# Patient Record
Sex: Female | Born: 1995 | Race: Black or African American | Hispanic: No | Marital: Single | Smoking: Never smoker
Health system: Southern US, Community
[De-identification: ages and names within clinical notes are randomized; demographics above are authoritative.]

## PROBLEM LIST (undated history)

## (undated) DIAGNOSIS — J45909 Unspecified asthma, uncomplicated: Secondary | ICD-10-CM

---

## 2015-01-07 ENCOUNTER — Encounter (HOSPITAL_COMMUNITY): Payer: Self-pay | Admitting: Emergency Medicine

## 2015-01-07 ENCOUNTER — Emergency Department (HOSPITAL_COMMUNITY)
Admission: EM | Admit: 2015-01-07 | Discharge: 2015-01-07 | Disposition: A | Discharge status: Home / Self Care | Payer: Self-pay | Attending: Emergency Medicine | Admitting: Emergency Medicine

## 2015-01-07 DIAGNOSIS — L02411 Cutaneous abscess of right axilla: Secondary | ICD-10-CM

## 2015-01-07 MED ORDER — LIDOCAINE HCL (PF) 1 % IJ SOLN: 5.0000 mL | Freq: Once | INTRAMUSCULAR | Status: DC

## 2015-01-07 MED ORDER — HYDROCODONE-ACETAMINOPHEN 5-325 MG PO TABS: 1.0000 | ORAL_TABLET | ORAL | Status: DC | PRN

## 2015-01-07 MED ORDER — LIDOCAINE HCL 1 % IJ SOLN
20.0000 mL | Freq: Once | INTRAMUSCULAR | Status: AC
  Administered 2015-01-07: 20 mL
  Filled 2015-01-07: qty 20

## 2015-01-07 NOTE — Discharge Instructions (Signed)

## 2015-01-07 NOTE — ED Provider Notes (Signed)
CSN: [id number]     Arrival date & time 01/07/15  1759 History  This chart was scribed for non-physician provider Elpidio Anis, PA-C, working with Lorre Nick, MD by Phillis Haggis, ED Scribe. This patient was seen in room WTR9/WTR9 and patient care was started at 8:17 PM.   Chief Complaint  Patient presents with  . Abscess   The history is provided by the patient. No language interpreter was used.  HPI Comments: Becky Mcdonald is a 19 y.o. female who presents to the Emergency Department complaining of abscess under bilateral axilla onset two months ago. She states that she saw her PCP two months ago and was told to not shave under her arms to try and relieve symptoms. She states that they first started as small bumps, but worsened; states that the left axillary bump popped and mostly drained, but there is still something in there. She reports pain to both areas, rates 7/10. She states that she went to another hospital last night and was given anti-biotics. She denies history of similar symptoms. She denies fever, chills, rash, nausea, or vomiting.   History reviewed. No pertinent past medical history. History reviewed. No pertinent past surgical history. No family history on file. History  Substance Use Topics  . Smoking status: Never Smoker   . Smokeless tobacco: Not on file  . Alcohol Use: No   OB History    No data available     Review of Systems  Constitutional: Negative for fever and chills.  Gastrointestinal: Negative for nausea and vomiting.  Skin: Positive for wound. Negative for rash.    Allergies  Review of patient's allergies indicates no known allergies.  Home Medications   Prior to Admission medications   Not on File   BP 129/77 mmHg  Pulse 82  Temp(Src) 98.1 F (36.7 C) (Oral)  Resp 14  SpO2 100%  LMP 12/29/2014 (Exact Date)  Physical Exam  Constitutional: She is oriented to person, place, and time. She appears well-developed and well-nourished. No  distress.  HENT:  Head: Normocephalic and atraumatic.  Mouth/Throat: Oropharynx is clear and moist.  Eyes: Conjunctivae and EOM are normal.  Neck: Normal range of motion. Neck supple.  Cardiovascular: Normal rate, regular rhythm and normal heart sounds.   Pulmonary/Chest: Effort normal and breath sounds normal. No respiratory distress.  Musculoskeletal: Normal range of motion. She exhibits no edema.  Neurological: She is alert and oriented to person, place, and time. No sensory deficit.  Skin: Skin is warm and dry.  Swollen fluctuant non-erythematous area in right axilla measuring 2 x 3 cm consistent with abscess  Psychiatric: She has a normal mood and affect. Her behavior is normal.  Nursing note and vitals reviewed.   ED Course  Procedures (including critical care time) DIAGNOSTIC STUDIES:   COORDINATION OF CARE: 8:21 PM-Discussed treatment plan which includes abscess drainage and referral to surgeon with pt at bedside and pt agreed to plan.    Labs Review Labs Reviewed - No data to display  Imaging Review No results found.   EKG Interpretation None     INCISION AND DRAINAGE Performed by: Elpidio Anis A Consent: Verbal consent obtained. Risks and benefits: risks, benefits and alternatives were discussed Type: abscess  Body area: right axilla  Anesthesia: local infiltration  Incision was made with a scalpel.  Local anesthetic: lidocaine 2% w/o epinephrine  Anesthetic total: 2 ml  Complexity: complex Blunt dissection to break up loculations  Drainage: purulent  Drainage amount: small  Packing material:  1/4 in iodoform gauze  Patient tolerance: Patient tolerated the procedure well with no immediate complications.    MDM   Final diagnoses:  None    1. Axillary abscess  Minimal purulent drainage from abscess to right axilla. Will refer to surgery for further outpatient management of recurrent symptoms.   I personally performed the services  described in this documentation, which was scribed in my presence. The recorded information has been reviewed and is accurate.     Elpidio Anis, PA-C 01/08/15 1610  Lorre Nick, MD 01/11/15 1228

## 2015-01-07 NOTE — ED Notes (Signed)
Pt has abscesses to bilateral axillas, left side popped but right side did not.

## 2015-01-09 ENCOUNTER — Emergency Department (HOSPITAL_COMMUNITY)
Admission: EM | Admit: 2015-01-09 | Discharge: 2015-01-09 | Discharge status: Against Medical Advice | Payer: Self-pay | Attending: Emergency Medicine | Admitting: Emergency Medicine

## 2015-01-09 ENCOUNTER — Encounter (HOSPITAL_COMMUNITY): Payer: Self-pay | Admitting: *Deleted

## 2015-01-09 DIAGNOSIS — Z4801 Encounter for change or removal of surgical wound dressing: Secondary | ICD-10-CM | POA: Insufficient documentation

## 2015-01-09 DIAGNOSIS — Z5189 Encounter for other specified aftercare: Secondary | ICD-10-CM

## 2015-01-09 NOTE — ED Provider Notes (Signed)
Patient left the ED without being seen by the provider.  Filed Vitals:   01/09/15 1449  BP: 138/76  Pulse: 83  Temp: 98.8 F (37.1 C)  Resp: [address] PA-C 01/09/15 1610  Marlon Pel, PA-C 01/09/15 1610  Eber Hong, MD 01/09/15 1735

## 2015-01-09 NOTE — ED Notes (Signed)
Pt just walked out of department.

## 2015-01-09 NOTE — ED Notes (Signed)
Pt was seen on Monday 6/13 for bil axilla abscess. Pt had right axilla abscess lanced. Was told to follow up in 3 days to see if pt needed to have surgery. Pain 8/10.

## 2015-01-16 ENCOUNTER — Emergency Department (HOSPITAL_COMMUNITY)
Admission: EM | Admit: 2015-01-16 | Discharge: 2015-01-16 | Disposition: A | Discharge status: Home / Self Care | Payer: Self-pay | Attending: Emergency Medicine | Admitting: Emergency Medicine

## 2015-01-16 ENCOUNTER — Encounter (HOSPITAL_COMMUNITY): Payer: Self-pay | Admitting: *Deleted

## 2015-01-16 DIAGNOSIS — K5900 Constipation, unspecified: Secondary | ICD-10-CM | POA: Insufficient documentation

## 2015-01-16 DIAGNOSIS — L02411 Cutaneous abscess of right axilla: Secondary | ICD-10-CM | POA: Insufficient documentation

## 2015-01-16 DIAGNOSIS — R110 Nausea: Secondary | ICD-10-CM | POA: Insufficient documentation

## 2015-01-16 DIAGNOSIS — L02412 Cutaneous abscess of left axilla: Secondary | ICD-10-CM | POA: Insufficient documentation

## 2015-01-16 DIAGNOSIS — R630 Anorexia: Secondary | ICD-10-CM | POA: Insufficient documentation

## 2015-01-16 DIAGNOSIS — L02419 Cutaneous abscess of limb, unspecified: Secondary | ICD-10-CM

## 2015-01-16 LAB — CBC WITH DIFFERENTIAL/PLATELET
BASOS ABS: 0 10*3/uL (ref 0.0–0.1)
BASOS PCT: 0 % (ref 0–1)
Eosinophils Absolute: 0.1 10*3/uL (ref 0.0–0.7)
Eosinophils Relative: 1 % (ref 0–5)
HCT: 40.1 % (ref 36.0–46.0)
Hemoglobin: 13.8 g/dL (ref 12.0–15.0)
Lymphocytes Relative: 33 % (ref 12–46)
Lymphs Abs: 2.7 10*3/uL (ref 0.7–4.0)
MCH: 28.6 pg (ref 26.0–34.0)
MCHC: 34.4 g/dL (ref 30.0–36.0)
MCV: 83 fL (ref 78.0–100.0)
Monocytes Absolute: 0.6 10*3/uL (ref 0.1–1.0)
Monocytes Relative: 7 % (ref 3–12)
NEUTROS ABS: 4.7 10*3/uL (ref 1.7–7.7)
NEUTROS PCT: 59 % (ref 43–77)
PLATELETS: 271 10*3/uL (ref 150–400)
RBC: 4.83 MIL/uL (ref 3.87–5.11)
RDW: 13.2 % (ref 11.5–15.5)
WBC: 8 10*3/uL (ref 4.0–10.5)

## 2015-01-16 MED ORDER — OXYCODONE-ACETAMINOPHEN 5-325 MG PO TABS
2.0000 | ORAL_TABLET | Freq: Once | ORAL | Status: AC
  Administered 2015-01-16: 2 via ORAL
  Filled 2015-01-16: qty 2

## 2015-01-16 MED ORDER — LIDOCAINE HCL (PF) 1 % IJ SOLN
30.0000 mL | Freq: Once | INTRAMUSCULAR | Status: DC
  Filled 2015-01-16: qty 30

## 2015-01-16 MED ORDER — DOXYCYCLINE HYCLATE 100 MG PO TABS: 100.0000 mg | ORAL_TABLET | Freq: Two times a day (BID) | ORAL | Status: DC

## 2015-01-16 MED ORDER — LIDOCAINE-EPINEPHRINE 2 %-1:100000 IJ SOLN
20.0000 mL | Freq: Once | INTRAMUSCULAR | Status: DC
  Filled 2015-01-16: qty 20

## 2015-01-16 MED ORDER — LIDOCAINE-EPINEPHRINE (PF) 2 %-1:200000 IJ SOLN
20.0000 mL | Freq: Once | INTRAMUSCULAR | Status: AC
  Administered 2015-01-16: 20 mL via INTRADERMAL
  Filled 2015-01-16: qty 20

## 2015-01-16 MED ORDER — OXYCODONE-ACETAMINOPHEN 5-325 MG PO TABS: 1.0000 | ORAL_TABLET | ORAL | Status: DC | PRN

## 2015-01-16 NOTE — Op Note (Signed)
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  POST-OPERATIVE DIAGNOSIS:  Same  PROCEDURE:  Procedure(s): I&D bilateral axillary abscesses secondary to hidradenitis  SURGEON:  Surgeon(s): Almond Lint, MD  ANESTHESIA:   Percocet and 1% lidocaine with epi  DRAINS: none   LOCAL MEDICATIONS USED:  LIDOCAINE   SPECIMEN:  Source of Specimen:  culture from each axilla  DISPOSITION OF SPECIMEN:  microbiology  PLAN OF CARE: d/c home  PATIENT DISPOSITION:  fast track  FINDINGS:  Thick purulent drainage deep in bilateral axilla  EBL: min  PROCEDURE:   The procedure, risks and complications have been discussed in detail (including, infection, bleeding, need for additional procedures) with the patient, and the patient has signed consent to the procedure. The patient was informed that the wound would be left open.  The patient was placed into a comfortable position in her chair in fast track.  The skin of the right axilla was sterilely prepped and draped over the affected area in the usual fashion. Lidocaine was administered around the area of fluctuance.  I&D with a #11 blade was performed on the right axillary region.  Cultures were sent. Purulent drainage was present deep in the axilla.  The wound was packed with 1/4 inch iodoform.    The left side was addressed similarly.    The patient tolerated the procedure well.

## 2015-01-16 NOTE — ED Notes (Signed)
MD at bedside. 

## 2015-01-16 NOTE — Discharge Instructions (Signed)
Remove packing in 48 hours in shower.  OK to take ibuprofen.  If you take narcotics, do not drive.  Also, take stool softeners with narcotics.        Hidradenitis Suppurativa, Sweat Gland Abscess Hidradenitis suppurativa is a long lasting (chronic), uncommon disease of the sweat glands. With this, boil-like lumps and scarring develop in the groin, some times under the arms (axillae), and under the breasts. It may also uncommonly occur behind the ears, in the crease of the buttocks, and around the genitals.  CAUSES  The cause is from a blocking of the sweat glands. They then become infected. It may cause drainage and odor. It is not contagious. So it cannot be given to someone else. It most often shows up in puberty (about 62 to 20 years of age). But it may happen much later. It is similar to acne which is a disease of the sweat glands. This condition is slightly more common in African-Americans and women. SYMPTOMS   Hidradenitis usually starts as one or more red, tender, swellings in the groin or under the arms (axilla).  Over a period of hours to days the lesions get larger. They often open to the skin surface, draining clear to yellow-colored fluid.  The infected area heals with scarring. DIAGNOSIS  Your caregiver makes this diagnosis by looking at you. Sometimes cultures (growing germs on plates in the lab) may be taken. This is to see what germ (bacterium) is causing the infection.  TREATMENT   Topical germ killing medicine applied to the skin (antibiotics) are the treatment of choice. Antibiotics taken by mouth (systemic) are sometimes needed when the condition is getting worse or is severe.  Avoid tight-fitting clothing which traps moisture in.  Dirt does not cause hidradenitis and it is not caused by poor hygiene.  Involved areas should be cleaned daily using an antibacterial soap. Some patients find that the liquid form of Lever 2000, applied to the involved areas as a lotion  after bathing, can help reduce the odor related to this condition.  Sometimes surgery is needed to drain infected areas or remove scarred tissue. Removal of large amounts of tissue is used only in severe cases.  Birth control pills may be helpful.  Oral retinoids (vitamin A derivatives) for 6 to 12 months which are effective for acne may also help this condition.  Weight loss will improve but not cure hidradenitis. It is made worse by being overweight. But the condition is not caused by being overweight.  This condition is more common in people who have had acne.  It may become worse under stress. There is no medical cure for hidradenitis. It can be controlled, but not cured. The condition usually continues for years with periods of getting worse and getting better (remission). Document Released: 02/25/2004 Document Revised: 10/05/2011 Document Reviewed: 10/13/2013 [facility] Patient Information 2015 [place], [place]. This information is not intended to replace advice given to you by your health care provider. Make sure you discuss any questions you have with your health care provider.

## 2015-01-16 NOTE — Consult Note (Signed)
Reason for Consult: axillary abscesses Referring Physician: Dr. Merlene Laughter  Becky Mcdonald is an 19 y.o. female.  HPI: She was seen at [facility] 6/13 and had an I&D of right axillary abscess.  She was apparently referred to our clinic, however, unable to be seen due to insurance issues.  She presents today with apparent fevers of 103, axillary pain associated with nausea and constipation from pain medicine.   This is now better after taking dulcolax.   She reports draining on the left side.  None on the left.  She completed her antibiotic course.  Mother endorses to same symptoms when she was younger, but none now.  No family history of hidradenitis other than this.  Reports moderate pain.  Denies chills or sweats.   History reviewed. No pertinent past medical history.  History reviewed. No pertinent past surgical history.  History reviewed. No pertinent family history.  Social History:  reports that she has never smoked. She does not have any smokeless tobacco history on file. She reports that she does not drink alcohol. Her drug history is not on file.  Allergies: No Known Allergies  Medications:  Prior to Admission medications   Medication Sig Start Date End Date Taking? Authorizing Provider  HYDROcodone-acetaminophen (NORCO/VICODIN) 5-325 MG per tablet Take 1-2 tablets by mouth every 4 (four) hours as needed. 01/07/15   Elpidio Anis, PA-C     No results found for this or any previous visit (from the past 48 hour(s)).  No results found.  Review of Systems  All other systems reviewed and are negative.  Blood pressure 109/52, pulse 73, temperature 98.4 F (36.9 C), temperature source Oral, resp. rate 20, last menstrual period 12/29/2014, SpO2 100 %. Physical Exam  Constitutional: She is oriented to person, place, and time. She appears well-developed and well-nourished. No distress.  Cardiovascular: Normal rate, regular rhythm, normal heart sounds and intact distal pulses.  Exam reveals  no gallop and no friction rub.   No murmur heard. Respiratory: Effort normal and breath sounds normal. No respiratory distress. She has no wheezes. She has no rales. She exhibits no tenderness.  GI: Soft. Bowel sounds are normal.  Neurological: She is alert and oriented to person, place, and time.  Skin: She is not diaphoretic.  Right axillae-about 1x3cm area of fluctuance and tenderness, no drainage. Left axillae-several areas of induration and a pinpoint area of purulent drainage.   Psychiatric: She has a normal mood and affect. Her behavior is normal. Judgment and thought content normal.    Assessment/Plan: Axillary hidradenitis: seems pretty mild, but may need a bedside I&D on the right side and home with clindamycin.  Will discuss with attending.  She will need patient education from [facility] for ongoing self care.  Further recs to follow.   Jayzon Taras ANP-BC 01/16/2015, 4:25 PM

## 2015-01-16 NOTE — ED Notes (Signed)
Pt given an XL scrub top to change into

## 2015-01-16 NOTE — ED Notes (Signed)
NP at bedside updating pt and mother.

## 2015-01-16 NOTE — ED Notes (Signed)
Pt presents today for recheck of abscess in each axilla. Pt was treated on 01-08-15 for same. Pt reports her pain scale is 10/10 in the Rt and LT. Mother reported " [facility] will not see Pt with out insn." Pt mother wants to know If Daughter can be admitted to have surgery on sites in each axilla.

## 2015-01-16 NOTE — ED Provider Notes (Signed)
CSN: [id number]     Arrival date & time 01/16/15  1438 History   This chart was scribed for non-physician practitioner,Hope M. Damian Leavell, NP, working with Samuel Jester, DO, by Budd Palmer ED Scribe. This patient was seen in room TR07C/TR07C and the patient's care was started at 3:11 PM   Chief Complaint  Patient presents with  . Wound Check   The history is provided by the patient. No language interpreter was used.   HPI Comments: Becky Mcdonald is a 19 y.o. female who presents to the Emergency Department complaining of two constantly painful boils, one under each armpit, onset several weeks ago. She states that she went to [facility] 2 weeks ago, where she was diagnosed with an abscess under the right and left axilla.  She received antibiotics and pain medication, which she has taken with no relief.  She was also referred to [facility], but is uninsured and found the cost of the consult to be prohibitive. Since this time, the pain has continued and she notes loss of appetite, constipation, nausea, and difficulty sleeping.  She began taking stool softener last night. She denies associated fever and chills.  History reviewed. No pertinent past medical history. History reviewed. No pertinent past surgical history. History reviewed. No pertinent family history. History  Substance Use Topics  . Smoking status: Never Smoker   . Smokeless tobacco: Not on file  . Alcohol Use: No   OB History    No data available     Review of Systems  Constitutional: Positive for appetite change. Negative for fever and chills.  Gastrointestinal: Positive for nausea and constipation.  Skin: Positive for wound.  all other systems negative  Allergies  Review of patient's allergies indicates no known allergies.  Home Medications   Prior to Admission medications   Medication Sig Start Date End Date Taking? Authorizing Provider  doxycycline (VIBRA-TABS) 100 MG tablet Take 1 tablet (100 mg  total) by mouth 2 (two) times daily. 01/16/15   Almond Lint, MD  HYDROcodone-acetaminophen (NORCO/VICODIN) 5-325 MG per tablet Take 1-2 tablets by mouth every 4 (four) hours as needed. 01/07/15   Elpidio Anis, PA-C  oxyCODONE-acetaminophen (ROXICET) 5-325 MG per tablet Take 1-2 tablets by mouth every 4 (four) hours as needed for severe pain. 01/16/15   Almond Lint, MD   BP 109/52 mmHg  Pulse 73  Temp(Src) 98.4 F (36.9 C) (Oral)  Resp 20  SpO2 100%  LMP 12/29/2014 (Exact Date) Physical Exam  Constitutional: She is oriented to person, place, and time. She appears well-developed and well-nourished. No distress.  HENT:  Head: Normocephalic and atraumatic.  Eyes: Conjunctivae and EOM are normal.  Neck: Normal range of motion. Neck supple.  Cardiovascular: Normal rate.   Pulmonary/Chest: Effort normal. No respiratory distress.  Abdominal: Soft.  Musculoskeletal: Normal range of motion.  Bilateral axilla with raised tender areas suggestive of hidradenitis   Neurological: She is alert and oriented to person, place, and time.  Skin: Skin is warm and dry.  Psychiatric: She has a normal mood and affect. Her behavior is normal.  Nursing note and vitals reviewed.   ED Course  Procedures  DIAGNOSTIC STUDIES: Oxygen Saturation is 100% on RA, normal by my interpretation.    COORDINATION OF CARE: 3:14 PM - Discussed plans to perform a possible incision and drainage. Pt advised of plan for treatment and pt agrees. Discussed with Dr. Clarene Duke and she suggest consult with [facility]. Discussed with [facility] and they will see the patient  in the ED now.   MDM  Dr. Donell Beers in to I&D abscess to bilateral axilla (see surgical consult note)  Final diagnoses:  Abscess, axilla   I personally performed the services described in this documentation, which was scribed in my presence. The recorded information has been reviewed and is accurate.    [facility] Orlene Och, NP 01/16/15 1926  Samuel Jester, DO 01/20/15  1345

## 2015-01-19 LAB — WOUND CULTURE: SPECIAL REQUESTS: NORMAL

## 2015-01-20 LAB — WOUND CULTURE
Culture: NO GROWTH
Special Requests: NORMAL

## 2015-01-30 ENCOUNTER — Emergency Department (HOSPITAL_COMMUNITY)
Admission: EM | Admit: 2015-01-30 | Discharge: 2015-01-30 | Disposition: A | Discharge status: Home / Self Care | Payer: Self-pay | Attending: Emergency Medicine | Admitting: Emergency Medicine

## 2015-01-30 ENCOUNTER — Encounter (HOSPITAL_COMMUNITY): Payer: Self-pay | Admitting: Emergency Medicine

## 2015-01-30 DIAGNOSIS — Z792 Long term (current) use of antibiotics: Secondary | ICD-10-CM | POA: Insufficient documentation

## 2015-01-30 DIAGNOSIS — L02411 Cutaneous abscess of right axilla: Secondary | ICD-10-CM | POA: Insufficient documentation

## 2015-01-30 DIAGNOSIS — Z4801 Encounter for change or removal of surgical wound dressing: Secondary | ICD-10-CM | POA: Insufficient documentation

## 2015-01-30 DIAGNOSIS — R509 Fever, unspecified: Secondary | ICD-10-CM | POA: Insufficient documentation

## 2015-01-30 DIAGNOSIS — Z5189 Encounter for other specified aftercare: Secondary | ICD-10-CM

## 2015-01-30 NOTE — ED Notes (Signed)
PA at bedside.

## 2015-01-30 NOTE — ED Notes (Signed)
Family at bedside. 

## 2015-01-30 NOTE — Discharge Instructions (Signed)

## 2015-01-30 NOTE — ED Notes (Signed)
See PAs note for assessment.

## 2015-01-30 NOTE — ED Notes (Signed)
Pt had abscesses under arm I&D on 6/22. sts under R arm abscess returned and some of the fluid has come out. Pt has follow up apt tomorrow.

## 2015-01-30 NOTE — ED Provider Notes (Signed)
CSN: [id number]     Arrival date & time 01/30/15  2049 History  This chart was scribed for Roxy Horseman, PA-C working with Cathren Laine, MD by Evon Slack, ED Scribe. This patient was seen in room TR05C/TR05C and the patient's care was started at 9:06 PM.      Chief Complaint  Patient presents with  . Abscess    The history is provided by the patient and a parent. No language interpreter was used.   HPI Comments: Becky Mcdonald is a 19 y.o. female who presents to the Emergency Department complaining of bilateral axilla abscess onset 2 weeks prior. Pt states that she still has some associated drainage from the right axilla. Pt also reports associated fever (max temp 103). Pt states that she was evaluated at [facility] 2 weeks prior for the same symptoms. She states that she was evaluated 1 week prior here at [facility] ED and had to have the abscesses surgically drained. She states that each abscess was packed and that she removed the packing without any complications. Pt states that she has been complaint with taking the antibiotics. She states that today she has taken tylenol with relief of the fever. Pt states that she called the surgeons office today and was told to come to ED if the abscess continued to drain. Pt states that she has a follow up appointment with the surgeon tomorrow.     History reviewed. No pertinent past medical history. History reviewed. No pertinent past surgical history. No family history on file. History  Substance Use Topics  . Smoking status: Never Smoker   . Smokeless tobacco: Not on file  . Alcohol Use: No   OB History    No data available     Review of Systems  Constitutional: Positive for fever.  Skin:       right axilla abscess     Allergies  Review of patient's allergies indicates no known allergies.  Home Medications   Prior to Admission medications   Medication Sig Start Date End Date Taking? Authorizing Provider  acetaminophen (TYLENOL)  325 MG tablet Take 650 mg by mouth every 6 (six) hours as needed for mild pain.   Yes Historical Provider, MD  doxycycline (VIBRA-TABS) 100 MG tablet Take 1 tablet (100 mg total) by mouth 2 (two) times daily. 01/16/15  Yes Almond Lint, MD  oxyCODONE-acetaminophen (ROXICET) 5-325 MG per tablet Take 1-2 tablets by mouth every 4 (four) hours as needed for severe pain. 01/16/15  Yes Almond Lint, MD   BP 135/71 mmHg  Pulse 65  Temp(Src) 97.7 F (36.5 C) (Oral)  Resp 16  SpO2 99%  LMP 01/26/2015   Physical Exam  Constitutional: She is oriented to person, place, and time. She appears well-developed and well-nourished. No distress.  HENT:  Head: Normocephalic and atraumatic.  Eyes: Conjunctivae and EOM are normal.  Neck: Neck supple. No tracheal deviation present.  Cardiovascular: Normal rate.   Pulmonary/Chest: Effort normal. No respiratory distress.  Musculoskeletal: Normal range of motion.  Neurological: She is alert and oriented to person, place, and time.  Skin: Skin is warm and dry.  Moderate purulent discharge from right axilla, no surrounding cellulitis, mildly indurated.   Psychiatric: She has a normal mood and affect. Her behavior is normal.  Nursing note and vitals reviewed.   ED Course  Procedures (including critical care time) DIAGNOSTIC STUDIES: Oxygen Saturation is 99% on RA, normal by my interpretation.    COORDINATION OF CARE: 9:21 PM-Discussed treatment plan  with pt at bedside and pt agreed to plan.  10:03 PM-Spoke with Dr. Denton LankSteinl and he recommended surgery follow up tomorrow.    Labs Review Labs Reviewed - No data to display  Imaging Review No results found.   EKG Interpretation None      MDM   Final diagnoses:  Wound check, abscess   Patient here for wound check.  Right axilla still draining some mild purulence.  No surrounding erythema, no palpable abscess.  Will recommend keeping surgery follow-up tomorrow.  Vitals are stable.  Non-toxic  appearing.  Filed Vitals:   01/30/15 2207  BP: 131/69  Pulse: 85  Temp:   Resp: 18     I personally performed the services described in this documentation, which was scribed in my presence. The recorded information has been reviewed and is accurate.       Roxy Horsemanobert Ardena Gangl, PA-C 01/30/15 2213  Cathren LaineKevin Steinl, MD 02/04/15 [phone]

## 2015-01-31 ENCOUNTER — Other Ambulatory Visit: Payer: Self-pay | Admitting: *Deleted

## 2015-01-31 ENCOUNTER — Other Ambulatory Visit: Payer: Self-pay | Admitting: General Surgery

## 2015-01-31 DIAGNOSIS — L02419 Cutaneous abscess of limb, unspecified: Secondary | ICD-10-CM

## 2015-02-05 ENCOUNTER — Ambulatory Visit
Admission: RE | Admit: 2015-02-05 | Discharge: 2015-02-05 | Disposition: A | Discharge status: Home / Self Care | Payer: No Typology Code available for payment source | Source: Ambulatory Visit | Attending: General Surgery | Admitting: General Surgery

## 2015-02-05 DIAGNOSIS — L02419 Cutaneous abscess of limb, unspecified: Secondary | ICD-10-CM

## 2015-02-05 MED ORDER — IOPAMIDOL (ISOVUE-300) INJECTION 61%
75.0000 mL | Freq: Once | INTRAVENOUS | Status: AC | PRN
  Administered 2015-02-05: 75 mL via INTRAVENOUS

## 2016-03-24 ENCOUNTER — Encounter (HOSPITAL_COMMUNITY): Payer: Self-pay | Admitting: Emergency Medicine

## 2016-03-24 ENCOUNTER — Emergency Department (HOSPITAL_COMMUNITY)
Admission: EM | Admit: 2016-03-24 | Discharge: 2016-03-24 | Discharge status: Against Medical Advice | Payer: Self-pay | Attending: Dermatology | Admitting: Dermatology

## 2016-03-24 DIAGNOSIS — J45909 Unspecified asthma, uncomplicated: Secondary | ICD-10-CM | POA: Insufficient documentation

## 2016-03-24 DIAGNOSIS — Z79899 Other long term (current) drug therapy: Secondary | ICD-10-CM | POA: Insufficient documentation

## 2016-03-24 DIAGNOSIS — R0602 Shortness of breath: Secondary | ICD-10-CM | POA: Insufficient documentation

## 2016-03-24 HISTORY — DX: Unspecified asthma, uncomplicated: J45.909

## 2016-03-24 MED ORDER — ALBUTEROL SULFATE (2.5 MG/3ML) 0.083% IN NEBU
5.0000 mg | INHALATION_SOLUTION | Freq: Once | RESPIRATORY_TRACT | Status: AC
  Administered 2016-03-24: 5 mg via RESPIRATORY_TRACT

## 2016-03-24 NOTE — ED Triage Notes (Addendum)
Pt states that she ran out of her inhaler tonight and is feeling SOB. States her new one comes tomorrow. 100% RA. Hx of Asthma. Alert and oriented.

## 2016-03-24 NOTE — ED Triage Notes (Signed)
Pt called x 2 w/o answer in lobby 

## 2016-03-24 NOTE — ED Triage Notes (Signed)
Pt called from triage, no answer 

## 2016-07-02 ENCOUNTER — Emergency Department (HOSPITAL_COMMUNITY): Payer: Self-pay

## 2016-07-02 ENCOUNTER — Encounter (HOSPITAL_COMMUNITY): Payer: Self-pay | Admitting: Emergency Medicine

## 2016-07-02 ENCOUNTER — Emergency Department (HOSPITAL_COMMUNITY)
Admission: EM | Admit: 2016-07-02 | Discharge: 2016-07-02 | Disposition: A | Discharge status: Home / Self Care | Payer: Self-pay | Attending: Emergency Medicine | Admitting: Emergency Medicine

## 2016-07-02 DIAGNOSIS — J019 Acute sinusitis, unspecified: Secondary | ICD-10-CM

## 2016-07-02 DIAGNOSIS — J0100 Acute maxillary sinusitis, unspecified: Secondary | ICD-10-CM | POA: Insufficient documentation

## 2016-07-02 DIAGNOSIS — J069 Acute upper respiratory infection, unspecified: Secondary | ICD-10-CM | POA: Insufficient documentation

## 2016-07-02 DIAGNOSIS — J0110 Acute frontal sinusitis, unspecified: Secondary | ICD-10-CM | POA: Insufficient documentation

## 2016-07-02 DIAGNOSIS — J45909 Unspecified asthma, uncomplicated: Secondary | ICD-10-CM | POA: Insufficient documentation

## 2016-07-02 MED ORDER — ALBUTEROL SULFATE HFA 108 (90 BASE) MCG/ACT IN AERS: 2.0000 | INHALATION_SPRAY | Freq: Four times a day (QID) | RESPIRATORY_TRACT | 1 refills | Status: AC | PRN

## 2016-07-02 MED ORDER — ALBUTEROL SULFATE (2.5 MG/3ML) 0.083% IN NEBU
5.0000 mg | INHALATION_SOLUTION | Freq: Once | RESPIRATORY_TRACT | Status: AC
  Administered 2016-07-02: 5 mg via RESPIRATORY_TRACT
  Filled 2016-07-02: qty 6

## 2016-07-02 MED ORDER — DM-GUAIFENESIN ER 30-600 MG PO TB12: 1.0000 | ORAL_TABLET | Freq: Two times a day (BID) | ORAL | 0 refills | Status: AC

## 2016-07-02 MED ORDER — AMOXICILLIN-POT CLAVULANATE 875-125 MG PO TABS: 1.0000 | ORAL_TABLET | Freq: Two times a day (BID) | ORAL | 0 refills | Status: AC

## 2016-07-02 MED ORDER — FLUTICASONE PROPIONATE 50 MCG/ACT NA SUSP: 2.0000 | Freq: Every day | NASAL | 0 refills | Status: DC

## 2016-07-02 MED ORDER — SALINE SPRAY 0.65 % NA SOLN: 1.0000 | NASAL | 0 refills | Status: AC | PRN

## 2016-07-02 NOTE — ED Notes (Signed)
ED Provider at bedside. 

## 2016-07-02 NOTE — ED Triage Notes (Signed)
Pt c/o body aches x 7 days, deep cough, nasal congestion, sinus pressure, headaches, SOB, back pain worse with inspiration, clear ocular discharge. 1 episode emesis today. No abdominal pain, fevers, chills, diarrhea. Hx asthma.

## 2016-07-02 NOTE — Discharge Instructions (Signed)
Thank you for visiting ED today. As it looks like you're having some upper respiratory infection causing worsening of your asthma, and sinusitis. Please take your medicine as directed.

## 2016-07-02 NOTE — ED Provider Notes (Signed)
Emergency Department Provider Note   I have reviewed the triage vital signs and the nursing notes.   HISTORY  Chief Complaint No chief complaint on file.   HPI Becky Mcdonald is a 20 y.o. female with past medical history significant for well-controlled asthma came to ED with complaint of worsening generalized body aches, headache, nasal and sinus congestion, cough with yellow mucus and shortness of breath for one week. She denies any fever and chills. She do endorse having some nausea and vomiting today. She states that her shortness of breath worsened today which brought her to ED. Her breathing improved with the treatment with nebulizer with Proventil. She was using ibuprofen 400 mg 3 times a day for the last 1 week with minimum relief. She was also using her albuterol inhaler almost every hourly with minimum relief. She was complaining of some chest pain associated with coughing.  Past Medical History:  Diagnosis Date  . Asthma     There are no active problems to display for this patient.   History reviewed. No pertinent surgical history.  Current Outpatient Rx  . Order #: [id number] Class: Historical Med    Allergies Patient has no known allergies.  No family history on file.  Social History Social History  Substance Use Topics  . Smoking status: Never Smoker  . Smokeless tobacco: Not on file  . Alcohol use No    Review of Systems Constitutional: No fever/chills,Headache Eyes: No visual changes. ENT: No sore throat. Sinus pain Cardiovascular:  chest pain Associated with cough Respiratory: shortness of breath. Gastrointestinal:  abdominal pain.   nausea,  vomiting.  No diarrhea.  No constipation. Genitourinary: Negative for dysuria. Musculoskeletal: Negative for back pain. Skin: Negative for rash. Neurological: Negative for headaches, focal weakness or numbness. 10-point ROS otherwise negative.  ____________________________________________   PHYSICAL  EXAM:  VITAL SIGNS: ED Triage Vitals  Enc Vitals Group     BP 07/02/16 1755 146/61     Pulse Rate 07/02/16 1755 84     Resp 07/02/16 1755 18     Temp 07/02/16 1755 97.8 F (36.6 C)     Temp Source 07/02/16 1755 Oral     SpO2 07/02/16 1755 100 %     Weight 07/02/16 1755 145 lb (65.8 kg)     Height 07/02/16 1755 5\' 4"  (1.626 m)     Head Circumference --      Peak Flow --      Pain Score 07/02/16 1801 10     Pain Loc --      Pain Edu? --      Excl. in GC? --    Constitutional: Alert and oriented. Well appearing and in no acute distress. Eyes: Conjunctivae are normal. PERRL. EOMI. Head: Atraumatic.Maxillary and frontal sinus tenderness. Ears:  Healthy appearing ear canals and TMs bilaterally Nose: Mild congestion and rhinnorhea. Mouth/Throat: Mucous membranes are moist.  Oropharynx non-erythematous. Neck: No stridor.  No meningeal signs.  Cardiovascular: Normal rate, regular rhythm. Good peripheral circulation. Grossly normal heart sounds.   Respiratory: Normal respiratory effort.  No retractions. Lungs CTAB. Gastrointestinal: Soft and nontender. No distention.  Neurologic:  Normal speech and language. No gross focal neurologic deficits are appreciated.  Skin:  Skin is warm, dry and intact. No rash noted. Psychiatric: Mood and affect are normal. Speech and behavior are normal.  ____________________________________________   LABS (all labs ordered are listed, but only abnormal results are displayed)  Labs Reviewed - No data to display ____________________________________________  EKG  None ____________________________________________  RADIOLOGY  Dg Chest 2 View  Result Date: 07/02/2016 CLINICAL DATA:  20 year old female with shortness of breath cough and body ache for 1 week. Initial encounter. EXAM: CHEST  2 VIEW COMPARISON:  None. FINDINGS: Lung volumes at the upper limits of normal to hyperinflated. Normal cardiac size and mediastinal contours. Visualized tracheal air  column is within normal limits. No pneumothorax, pulmonary edema, pleural effusion or confluent pulmonary opacity. Borderline to mild increased pulmonary interstitial markings. No acute osseous abnormality identified. Negative visible bowel gas pattern. IMPRESSION: Up to mild increased pulmonary interstitial markings, and evidence of pulmonary hyperinflation. Consider viral or atypical respiratory infection. No focal pneumonia or pleural effusion. Electronically Signed   By: Odessa FlemingH  Hall M.D.   On: 07/02/2016 18:40    ____________________________________________   PROCEDURES  Procedure(s) performed:   Procedures   ____________________________________________   INITIAL IMPRESSION / ASSESSMENT AND PLAN / ED COURSE  Pertinent labs & imaging results that were available during my care of the patient were reviewed by me and considered in my medical decision making (see chart for details).  Becky Mcdonald is a 20 y.o. female with past medical history significant for well-controlled asthma came to ED with complaint of worsening generalized body aches, headache, nasal and sinus congestion, cough with yellow mucus and shortness of breath for one week. She denies any fever and chills. She do endorse having some nausea and vomiting today.  Her shortness of breath improved with Proventil nebulizer treatment. Chest x-ray was showing mild increased pulmonary interstitial markings, and evidence of pulmonary hyperinflation. Consider viral or atypical respiratory infection. No focal pneumonia or pleural effusion.  Her symptoms are consistent with upper respiratory infection and sinusitis.  She is being discharged home with Augmentin, albuterol inhaler with spacer, Flonase nasal spray and Mucinex.    ____________________________________________  FINAL CLINICAL IMPRESSION(S) / ED DIAGNOSES  Final diagnoses:  None     MEDICATIONS GIVEN DURING THIS VISIT:  Medications  albuterol (PROVENTIL) (2.5  MG/3ML) 0.083% nebulizer solution 5 mg (5 mg Nebulization Given 07/02/16 1808)     NEW OUTPATIENT MEDICATIONS STARTED DURING THIS VISIT:  New Prescriptions   No medications on file      Note:  This document was prepared using Dragon voice recognition software and may include unintentional dictation errors.  Alona BeneJoshua Long, MD Emergency Medicine   Arnetha CourserSumayya Areon Cocuzza, MD 07/02/16 [id number]    Jacalyn LefevreJulie Haviland, MD 07/02/16 [phone]

## 2018-01-12 ENCOUNTER — Emergency Department (HOSPITAL_COMMUNITY)
Admission: EM | Admit: 2018-01-12 | Discharge: 2018-01-12 | Disposition: A | Discharge status: Home / Self Care | Payer: Self-pay | Attending: Emergency Medicine | Admitting: Emergency Medicine

## 2018-01-12 ENCOUNTER — Encounter (HOSPITAL_COMMUNITY): Payer: Self-pay

## 2018-01-12 ENCOUNTER — Other Ambulatory Visit: Payer: Self-pay

## 2018-01-12 DIAGNOSIS — R058 Other specified cough: Secondary | ICD-10-CM

## 2018-01-12 DIAGNOSIS — R0981 Nasal congestion: Secondary | ICD-10-CM

## 2018-01-12 DIAGNOSIS — J4521 Mild intermittent asthma with (acute) exacerbation: Secondary | ICD-10-CM | POA: Insufficient documentation

## 2018-01-12 DIAGNOSIS — R05 Cough: Secondary | ICD-10-CM | POA: Insufficient documentation

## 2018-01-12 MED ORDER — PREDNISONE 20 MG PO TABS
60.0000 mg | ORAL_TABLET | Freq: Once | ORAL | Status: AC
  Administered 2018-01-12: 60 mg via ORAL
  Filled 2018-01-12: qty 3

## 2018-01-12 MED ORDER — LORATADINE 10 MG PO TABS: 10.0000 mg | ORAL_TABLET | Freq: Every day | ORAL | 0 refills | Status: AC

## 2018-01-12 MED ORDER — ALBUTEROL SULFATE HFA 108 (90 BASE) MCG/ACT IN AERS
1.0000 | INHALATION_SPRAY | Freq: Once | RESPIRATORY_TRACT | Status: AC
  Administered 2018-01-12: 1 via RESPIRATORY_TRACT
  Filled 2018-01-12: qty 6.7

## 2018-01-12 MED ORDER — PREDNISONE 10 MG PO TABS: 40.0000 mg | ORAL_TABLET | Freq: Every day | ORAL | 0 refills | Status: AC

## 2018-01-12 MED ORDER — IPRATROPIUM-ALBUTEROL 0.5-2.5 (3) MG/3ML IN SOLN
3.0000 mL | Freq: Once | RESPIRATORY_TRACT | Status: AC
  Administered 2018-01-12: 3 mL via RESPIRATORY_TRACT
  Filled 2018-01-12: qty 3

## 2018-01-12 MED ORDER — FLUTICASONE PROPIONATE 50 MCG/ACT NA SUSP: 2.0000 | Freq: Every day | NASAL | 0 refills | Status: AC

## 2018-01-12 NOTE — ED Notes (Signed)
Patient able to ambulate independently.  Patient verbalized understanding of all discharge paperwork.  E-signature pad in room not loading.  No questions at time of discharge

## 2018-01-12 NOTE — ED Triage Notes (Signed)
Pt endorses being out of her albuterol inhaler. Has been having asthma attacks over the last 3 days. VSS.

## 2018-01-12 NOTE — Discharge Instructions (Signed)
Start taking prednisone as prescribed beginning tomorrow.  You received the first dose in the emergency department today.  Use your albuterol inhaler up to 4 times daily as needed for shortness of breath.  Take Flonase and Claritin for cough and nasal congestion.  Drink plenty of fluids and get plenty rest.  Follow-up with the PCP for reevaluation of your symptoms if they persist.  Return to the emergency department immediately for any concerning signs or symptoms develop such as worsening shortness of breath or chest pain, high fevers, passing out, or persistent shortness of breath despite using the albuterol inhaler as prescribed.

## 2018-01-12 NOTE — ED Provider Notes (Signed)
[facility] EMERGENCY DEPARTMENT Provider Note   CSN: [id number] Arrival date & time: 01/12/18  1552     History   Chief Complaint Chief Complaint  Patient presents with  . Asthma  . Medication Refill    HPI Becky Mcdonald is a 22 y.o. female with history of asthma presents for evaluation of acute onset, progressively worsening nasal congestion, shortness of breath, and wheezing for 6 days.  She notes symptoms worsen at night.  Also notes nonproductive cough, sore throat and chest tightness but denies chest pain , difficulty breathing or swallowing, drooling, fevers, or chills.  She has tried an over-the-counter "asthma medication "but she does not remember the name of that she says was not helpful.  She ran out of her albuterol inhaler 4 months ago.  Does not have a PCP.  Denies recent travel or surgeries, hemoptysis, prior history of DVT or PE, OCP use, or leg swelling.  She is a non-smoker.  The history is provided by the patient.    Past Medical History:  Diagnosis Date  . Asthma     There are no active problems to display for this patient.   History reviewed. No pertinent surgical history.   OB History   None      Home Medications    Prior to Admission medications   Medication Sig Start Date End Date Taking? Authorizing Provider  albuterol (PROVENTIL HFA) 108 (90 Base) MCG/ACT inhaler Inhale 2 puffs into the lungs every 6 (six) hours as needed for wheezing or shortness of breath. 07/02/16   Arnetha Courser, MD  dextromethorphan-guaiFENesin [facility] DM) 30-600 MG 12hr tablet Take 1 tablet by mouth 2 (two) times daily. 07/02/16   Arnetha Courser, MD  fluticasone (FLONASE) 50 MCG/ACT nasal spray Place 2 sprays into both nostrils daily. 01/12/18   Anijah Spohr A, PA-C  loratadine (CLARITIN) 10 MG tablet Take 1 tablet (10 mg total) by mouth daily for 10 days. 01/12/18 01/22/18  Michela Pitcher A, PA-C  Multiple Vitamins-Minerals (MULTIVITAMIN ADULT) TABS Take 1 tablet  by mouth daily.    [provider]  predniSONE (DELTASONE) 10 MG tablet Take 4 tablets (40 mg total) by mouth daily with breakfast for 5 days. 01/12/18 01/17/18  Michela Pitcher A, PA-C  sodium chloride (OCEAN) 0.65 % SOLN nasal spray Place 1 spray into both nostrils as needed for congestion. 07/02/16   Arnetha Courser, MD    Family History History reviewed. No pertinent family history.  Social History Social History   Tobacco Use  . Smoking status: Never Smoker  Substance Use Topics  . Alcohol use: No  . Drug use: Never     Allergies   Patient has no known allergies.   Review of Systems Review of Systems  Constitutional: Negative for chills and fever.  HENT: Positive for congestion and sore throat. Negative for trouble swallowing.   Respiratory: Positive for cough, chest tightness, shortness of breath and wheezing.   Cardiovascular: Negative for chest pain and leg swelling.  Gastrointestinal: Negative for abdominal pain, nausea and vomiting.     Physical Exam Updated Vital Signs BP (!) 145/77 (BP Location: Right Arm)   Pulse 80   Temp 99.3 F (37.4 C) (Oral)   Resp 16   Ht 5\' 4"  (1.626 m)   Wt 79.4 kg (175 lb)   LMP 01/12/2018 (Exact Date)   SpO2 100%   BMI 30.04 kg/m   Physical Exam  Constitutional: She appears well-developed and well-nourished. No distress.  HENT:  Head: Normocephalic and atraumatic.  TMs with mid ear effusion bilaterally, no erythema or bulging.  Nasal septum midline with pale pink boggy mucosa, congestion worse on the left as compared to the right.  Posterior oropharynx with postnasal drip, very mild erythema but no tonsillar hypertrophy, exudates, uvular deviation, trismus, or sublingual abnormalities.  Eyes: Pupils are equal, round, and reactive to light. Conjunctivae and EOM are normal. Right eye exhibits no discharge. Left eye exhibits no discharge.  Neck: Normal range of motion. Neck supple. No JVD present. No tracheal deviation present.   Cardiovascular: Normal rate, regular rhythm, normal heart sounds and intact distal pulses.  2+ radial and DP/PT pulses bl, negative Homan's bl, no LE edema  Pulmonary/Chest: Effort normal. She has wheezes. She exhibits tenderness.  Mild right-sided anterior chest wall tenderness to palpation with no deformity, ecchymosis, flail segment, or paradoxical motion. equal rise and fall of chest, no increased work of breathing.  Speaking full sentences without difficulty.  Globally diminished breath sounds with scattered extra Tory wheezes in the posterior fields.  Abdominal: Soft. Bowel sounds are normal. She exhibits no distension. There is no tenderness.  Musculoskeletal: She exhibits no edema.  Lymphadenopathy:    She has no cervical adenopathy.  Neurological: She is alert.  Skin: Skin is warm and dry. No erythema.  Psychiatric: She has a normal mood and affect. Her behavior is normal.  Nursing note and vitals reviewed.    ED Treatments / Results  Labs (all labs ordered are listed, but only abnormal results are displayed) Labs Reviewed - No data to display  EKG None  Radiology No results found.  Procedures Procedures (including critical care time)  Medications Ordered in ED Medications  ipratropium-albuterol (DUONEB) 0.5-2.5 (3) MG/3ML nebulizer solution 3 mL (3 mLs Nebulization Given 01/12/18 1717)  albuterol (PROVENTIL HFA;VENTOLIN HFA) 108 (90 Base) MCG/ACT inhaler 1 puff (1 puff Inhalation Given 01/12/18 1717)  predniSONE (DELTASONE) tablet 60 mg (60 mg Oral Given 01/12/18 1717)     Initial Impression / Assessment and Plan / ED Course  I have reviewed the triage vital signs and the nursing notes.  Pertinent labs & imaging results that were available during my care of the patient were reviewed by me and considered in my medical decision making (see chart for details).     Patient with history of asthma who has run out of her home medicines presents with 6-day history of  nasal congestion, nonproductive cough, and intermittent shortness of breath and chest tightness.  She has some diminished breath sounds and scattered wheezes on auscultation of the lungs but she is afebrile, nontoxic in appearance.  She is PERC negative and I doubt PE.  She is no apparent distress, no increased work of breathing.  She was given prednisone, breathing treatment, and a refill of her albuterol inhaler.  On reevaluation she states she is feeling much better.  Will discharge with prednisone burst, Flonase and Claritin and she will take home the albuterol inhaler.  Recommend follow-up with PCP for reevaluation of symptoms if they persist.  Discussed strict ED return precautions. Pt verbalized understanding of and agreement with plan and is safe for discharge home at this time.   Final Clinical Impressions(s) / ED Diagnoses   Final diagnoses:  Mild intermittent asthma with exacerbation  Nasal congestion  Nonproductive cough    ED Discharge Orders        Ordered    predniSONE (DELTASONE) 10 MG tablet  Daily with breakfast  01/12/18 1753    fluticasone (FLONASE) 50 MCG/ACT nasal spray  Daily     01/12/18 1753    loratadine (CLARITIN) 10 MG tablet  Daily     01/12/18 1753       Jeanie Sewer, PA-C 01/12/18 Earlie Lou, MD 01/12/18 [phone]

## 2018-01-12 NOTE — ED Notes (Signed)
PA at bedside.

## 2020-08-02 ENCOUNTER — Other Ambulatory Visit: Payer: Self-pay

## 2020-08-02 ENCOUNTER — Encounter (HOSPITAL_BASED_OUTPATIENT_CLINIC_OR_DEPARTMENT_OTHER): Payer: Self-pay | Admitting: *Deleted

## 2020-08-02 ENCOUNTER — Emergency Department (HOSPITAL_BASED_OUTPATIENT_CLINIC_OR_DEPARTMENT_OTHER): Payer: Medicaid Other

## 2020-08-02 ENCOUNTER — Emergency Department (HOSPITAL_BASED_OUTPATIENT_CLINIC_OR_DEPARTMENT_OTHER)
Admission: EM | Admit: 2020-08-02 | Discharge: 2020-08-02 | Disposition: A | Discharge status: Home / Self Care | Payer: Medicaid Other | Attending: Emergency Medicine | Admitting: Emergency Medicine

## 2020-08-02 DIAGNOSIS — R0602 Shortness of breath: Secondary | ICD-10-CM | POA: Diagnosis present

## 2020-08-02 DIAGNOSIS — J45901 Unspecified asthma with (acute) exacerbation: Secondary | ICD-10-CM | POA: Insufficient documentation

## 2020-08-02 DIAGNOSIS — B349 Viral infection, unspecified: Secondary | ICD-10-CM | POA: Diagnosis not present

## 2020-08-02 LAB — RAPID INFLUENZA A&B ANTIGENS
Influenza A (ARMC): NEGATIVE
Influenza B (ARMC): NEGATIVE

## 2020-08-02 MED ORDER — PREDNISONE 20 MG PO TABS: 40.0000 mg | ORAL_TABLET | Freq: Every day | ORAL | 0 refills | Status: AC

## 2020-08-02 MED ORDER — ALBUTEROL SULFATE HFA 108 (90 BASE) MCG/ACT IN AERS
2.0000 | INHALATION_SPRAY | Freq: Once | RESPIRATORY_TRACT | Status: AC
  Administered 2020-08-02: 2 via RESPIRATORY_TRACT
  Filled 2020-08-02: qty 6.7

## 2020-08-02 NOTE — Discharge Instructions (Addendum)
You were evaluated in the Emergency Department and after careful evaluation, we did not find any emergent condition requiring admission or further testing in the hospital.  Your exam/testing today was overall reassuring.  You have tested negative for the flu.  Your EKG and chest x-ray were normal.  Suspect your symptoms are due to a viral illness causing a mild flare of your asthma.  Please continue taking Tylenol at home for discomfort.  Use the prednisone prescription to help with your breathing.  Use the inhaler as needed every 4 hours.  Please return to the Emergency Department if you experience any worsening of your condition.  Thank you for allowing [place] to be a part of your care.

## 2020-08-02 NOTE — ED Provider Notes (Signed)
[facility]EMERGENCY DEPT [facility] Emergency Department Provider Note MRN:  [id number]  Arrival date & time: 08/02/20     Chief Complaint   Shortness of Breath   History of Present Illness   Becky Mcdonald is a 25 y.o. year-old female with a history of asthma presenting to the ED with chief complaint of shortness of breath.  Worsening chest tightness and shortness of breath of the past 1 to 2 days.  Has been having sore throat, ear pain, dry cough, malaise, fatigue, nasal congestion.  Ran out of inhaler a few days ago.  Denies history of blood clots, no birth control pills.  Symptoms constant, mild to moderate, no exacerbating or alleviating factors.  Review of Systems  A complete 10 system review of systems was obtained and all systems are negative except as noted in the HPI and PMH.   Patient's Health History    Past Medical History:  Diagnosis Date  . Asthma     History reviewed. No pertinent surgical history.  No family history on file.  Social History   Socioeconomic History  . Marital status: Single    Spouse name: Not on file  . Number of children: Not on file  . Years of education: Not on file  . Highest education level: Not on file  Occupational History  . Not on file  Tobacco Use  . Smoking status: Never Smoker  . Smokeless tobacco: Never Used  Substance and Sexual Activity  . Alcohol use: No  . Drug use: Never  . Sexual activity: Not on file  Other Topics Concern  . Not on file  Social History Narrative  . Not on file   Social Determinants of Health   Financial Resource Strain: Not on file  Food Insecurity: Not on file  Transportation Needs: Not on file  Physical Activity: Not on file  Stress: Not on file  Social Connections: Not on file  Intimate Partner Violence: Not on file     Physical Exam   Vitals:   08/02/20 2320 08/02/20 2335  BP:  (!) 142/82  Pulse:  86  Resp:  20  Temp:  99 F (37.2 C)  SpO2: 99% 100%    CONSTITUTIONAL:  Well-appearing, NAD NEURO:  Alert and oriented x 3, no focal deficits EYES:  eyes equal and reactive ENT/NECK:  no LAD, no JVD CARDIO: Regular rate, well-perfused, normal S1 and S2 PULM:  CTAB no wheezing or rhonchi GI/GU:  normal bowel sounds, non-distended, non-tender MSK/SPINE:  No gross deformities, no edema SKIN:  no rash, atraumatic PSYCH:  Appropriate speech and behavior  *Additional and/or pertinent findings included in MDM below  Diagnostic and Interventional Summary    EKG Interpretation  Date/Time:  Friday August 02 2020 23:26:15 EST Ventricular Rate:  94 PR Interval:  148 QRS Duration: 70 QT Interval:  334 QTC Calculation: 417 R Axis:   72 Text Interpretation: Normal sinus rhythm Normal ECG Confirmed by Kennis Carina [phone]) on 08/02/2020 11:40:35 PM      Labs Reviewed  RAPID INFLUENZA A&B ANTIGENS    DG Chest Portable 1 View  Final Result      Medications  albuterol (VENTOLIN HFA) 108 (90 Base) MCG/ACT inhaler 2 puff (2 puffs Inhalation Given 08/02/20 2320)     Procedures  /  Critical Care Procedures  ED Course and Medical Decision Making  I have reviewed the triage vital signs, the nursing notes, and pertinent available records from the EMR.  Listed above are laboratory and  imaging tests that I personally ordered, reviewed, and interpreted and then considered in my medical decision making (see below for details).  Suspect viral illness causing mild flare of her asthma.  No tachycardia, no hypoxia, no evidence of DVT, doubt PE.  Anticipating discharge, will refill inhaler, would consider steroid burst.       Elmer Sow. Pilar Plate, MD [facility] Emergency Medicine [facility] [email or web address]  Final Clinical Impressions(s) / ED Diagnoses     ICD-10-CM   1. Viral illness  B34.9   2. Mild asthma with exacerbation, unspecified whether persistent  J45.901     ED Discharge Orders         Ordered    predniSONE (DELTASONE) 20 MG tablet   Daily        08/02/20 2341           Discharge Instructions Discussed with and Provided to Patient:     Discharge Instructions     You were evaluated in the Emergency Department and after careful evaluation, we did not find any emergent condition requiring admission or further testing in the hospital.  Your exam/testing today was overall reassuring.  You have tested negative for the flu.  Your EKG and chest x-ray were normal.  Suspect your symptoms are due to a viral illness causing a mild flare of your asthma.  Please continue taking Tylenol at home for discomfort.  Use the prednisone prescription to help with your breathing.  Use the inhaler as needed every 4 hours.  Please return to the Emergency Department if you experience any worsening of your condition.  Thank you for allowing [place] to be a part of your care.        Sabas Sous, MD 08/02/20 2342

## 2020-08-02 NOTE — ED Triage Notes (Addendum)
Sob x 3 days. Her at home Covid test was negative today. Pain in her ears. States she ran out of her inhaler 2 weeks ago. Her oxygen sats are 100% at triage.

## 2020-10-11 ENCOUNTER — Emergency Department (HOSPITAL_COMMUNITY): Payer: Medicaid Other

## 2020-10-11 ENCOUNTER — Emergency Department (HOSPITAL_COMMUNITY)
Admission: EM | Admit: 2020-10-11 | Discharge: 2020-10-11 | Disposition: A | Discharge status: Home / Self Care | Payer: Medicaid Other | Attending: Emergency Medicine | Admitting: Emergency Medicine

## 2020-10-11 ENCOUNTER — Encounter (HOSPITAL_COMMUNITY): Payer: Self-pay | Admitting: Emergency Medicine

## 2020-10-11 DIAGNOSIS — M79604 Pain in right leg: Secondary | ICD-10-CM

## 2020-10-11 DIAGNOSIS — J45909 Unspecified asthma, uncomplicated: Secondary | ICD-10-CM | POA: Insufficient documentation

## 2020-10-11 DIAGNOSIS — Y92410 Unspecified street and highway as the place of occurrence of the external cause: Secondary | ICD-10-CM | POA: Diagnosis not present

## 2020-10-11 DIAGNOSIS — Z7951 Long term (current) use of inhaled steroids: Secondary | ICD-10-CM | POA: Insufficient documentation

## 2020-10-11 DIAGNOSIS — S00511A Abrasion of lip, initial encounter: Secondary | ICD-10-CM | POA: Diagnosis not present

## 2020-10-11 DIAGNOSIS — S0993XA Unspecified injury of face, initial encounter: Secondary | ICD-10-CM | POA: Diagnosis present

## 2020-10-11 DIAGNOSIS — R0789 Other chest pain: Secondary | ICD-10-CM | POA: Diagnosis not present

## 2020-10-11 DIAGNOSIS — S80811A Abrasion, right lower leg, initial encounter: Secondary | ICD-10-CM | POA: Insufficient documentation

## 2020-10-11 MED ORDER — METHOCARBAMOL 500 MG PO TABS: 500.0000 mg | ORAL_TABLET | Freq: Two times a day (BID) | ORAL | 0 refills | Status: AC

## 2020-10-11 MED ORDER — NAPROXEN 250 MG PO TABS: 500.0000 mg | ORAL_TABLET | Freq: Two times a day (BID) | ORAL | 0 refills | Status: AC

## 2020-10-11 MED ORDER — ALBUTEROL SULFATE HFA 108 (90 BASE) MCG/ACT IN AERS
2.0000 | INHALATION_SPRAY | Freq: Once | RESPIRATORY_TRACT | Status: AC
  Administered 2020-10-11: 2 via RESPIRATORY_TRACT
  Filled 2020-10-11: qty 6.7

## 2020-10-11 MED ORDER — IBUPROFEN 400 MG PO TABS
600.0000 mg | ORAL_TABLET | Freq: Once | ORAL | Status: AC
  Administered 2020-10-11: 600 mg via ORAL
  Filled 2020-10-11: qty 1
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
## 2020-10-11 NOTE — ED Notes (Signed)
Registration at bedside at this time.

## 2020-10-11 NOTE — Discharge Instructions (Signed)
Please pick up medication and take as prescribed. DO NOT DRIVE WHILE ON THE MUSCLE RELAXER AS IT CAN MAKE YOU DROWSY.   Use albuterol inhaler as needed for your asthma  Follow up with your PCP regarding ED visit  Return to the ED for any worsening symptoms
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
